# Patient Record
Sex: Female | Born: 1950 | Race: Black or African American | Hispanic: No | Marital: Single | Smoking: Current some day smoker
Health system: Southern US, Community
[De-identification: ages and names within clinical notes are randomized; demographics above are authoritative.]

## PROBLEM LIST (undated history)

## (undated) DIAGNOSIS — E079 Disorder of thyroid, unspecified: Secondary | ICD-10-CM

## (undated) DIAGNOSIS — E7800 Pure hypercholesterolemia, unspecified: Secondary | ICD-10-CM

## (undated) DIAGNOSIS — K219 Gastro-esophageal reflux disease without esophagitis: Secondary | ICD-10-CM

## (undated) DIAGNOSIS — I10 Essential (primary) hypertension: Secondary | ICD-10-CM

## (undated) HISTORY — PX: ABDOMINAL HYSTERECTOMY: SHX81

---

## 2020-09-19 ENCOUNTER — Emergency Department (HOSPITAL_COMMUNITY): Payer: Medicare Other

## 2020-09-19 ENCOUNTER — Emergency Department (HOSPITAL_COMMUNITY)
Admission: EM | Admit: 2020-09-19 | Discharge: 2020-09-19 | Disposition: A | Discharge status: Home / Self Care | Payer: Medicare Other | Attending: Emergency Medicine | Admitting: Emergency Medicine

## 2020-09-19 ENCOUNTER — Other Ambulatory Visit: Payer: Self-pay

## 2020-09-19 ENCOUNTER — Encounter (HOSPITAL_COMMUNITY): Payer: Self-pay | Admitting: Emergency Medicine

## 2020-09-19 DIAGNOSIS — R109 Unspecified abdominal pain: Secondary | ICD-10-CM | POA: Diagnosis not present

## 2020-09-19 DIAGNOSIS — N644 Mastodynia: Secondary | ICD-10-CM | POA: Insufficient documentation

## 2020-09-19 DIAGNOSIS — F17210 Nicotine dependence, cigarettes, uncomplicated: Secondary | ICD-10-CM | POA: Diagnosis not present

## 2020-09-19 DIAGNOSIS — J189 Pneumonia, unspecified organism: Secondary | ICD-10-CM

## 2020-09-19 DIAGNOSIS — J181 Lobar pneumonia, unspecified organism: Secondary | ICD-10-CM | POA: Diagnosis not present

## 2020-09-19 DIAGNOSIS — I10 Essential (primary) hypertension: Secondary | ICD-10-CM | POA: Diagnosis not present

## 2020-09-19 DIAGNOSIS — R0602 Shortness of breath: Secondary | ICD-10-CM | POA: Diagnosis present

## 2020-09-19 HISTORY — DX: Gastro-esophageal reflux disease without esophagitis: K21.9

## 2020-09-19 HISTORY — DX: Disorder of thyroid, unspecified: E07.9

## 2020-09-19 HISTORY — DX: Pure hypercholesterolemia, unspecified: E78.00

## 2020-09-19 HISTORY — DX: Essential (primary) hypertension: I10

## 2020-09-19 LAB — URINALYSIS, ROUTINE W REFLEX MICROSCOPIC
Bilirubin Urine: NEGATIVE
Glucose, UA: NEGATIVE mg/dL
Hgb urine dipstick: NEGATIVE
Ketones, ur: NEGATIVE mg/dL
Leukocytes,Ua: NEGATIVE
Nitrite: NEGATIVE
Protein, ur: NEGATIVE mg/dL
Specific Gravity, Urine: 1.017 (ref 1.005–1.030)
pH: 5 (ref 5.0–8.0)

## 2020-09-19 LAB — CBC WITH DIFFERENTIAL/PLATELET
Abs Immature Granulocytes: 0.08 10*3/uL — ABNORMAL HIGH (ref 0.00–0.07)
Basophils Absolute: 0.1 10*3/uL (ref 0.0–0.1)
Basophils Relative: 1 %
Eosinophils Absolute: 0.1 10*3/uL (ref 0.0–0.5)
Eosinophils Relative: 1 %
HCT: 30.8 % — ABNORMAL LOW (ref 36.0–46.0)
Hemoglobin: 8.9 g/dL — ABNORMAL LOW (ref 12.0–15.0)
Immature Granulocytes: 1 %
Lymphocytes Relative: 9 %
Lymphs Abs: 1.4 10*3/uL (ref 0.7–4.0)
MCH: 18.4 pg — ABNORMAL LOW (ref 26.0–34.0)
MCHC: 28.9 g/dL — ABNORMAL LOW (ref 30.0–36.0)
MCV: 63.8 fL — ABNORMAL LOW (ref 80.0–100.0)
Monocytes Absolute: 1.5 10*3/uL — ABNORMAL HIGH (ref 0.1–1.0)
Monocytes Relative: 10 %
Neutro Abs: 11.9 10*3/uL — ABNORMAL HIGH (ref 1.7–7.7)
Neutrophils Relative %: 78 %
Platelets: 416 10*3/uL — ABNORMAL HIGH (ref 150–400)
RBC: 4.83 MIL/uL (ref 3.87–5.11)
RDW: 23.7 % — ABNORMAL HIGH (ref 11.5–15.5)
WBC: 15 10*3/uL — ABNORMAL HIGH (ref 4.0–10.5)
nRBC: 0 % (ref 0.0–0.2)

## 2020-09-19 LAB — LIPASE, BLOOD: Lipase: 49 U/L (ref 11–51)

## 2020-09-19 LAB — COMPREHENSIVE METABOLIC PANEL
ALT: 11 U/L (ref 0–44)
AST: 17 U/L (ref 15–41)
Albumin: 3.9 g/dL (ref 3.5–5.0)
Alkaline Phosphatase: 77 U/L (ref 38–126)
Anion gap: 9 (ref 5–15)
BUN: 14 mg/dL (ref 8–23)
CO2: 27 mmol/L (ref 22–32)
Calcium: 9.1 mg/dL (ref 8.9–10.3)
Chloride: 100 mmol/L (ref 98–111)
Creatinine, Ser: 0.61 mg/dL (ref 0.44–1.00)
GFR, Estimated: >60 mL/min (ref >60)
Glucose, Bld: 93 mg/dL (ref 70–99)
Potassium: 3.5 mmol/L (ref 3.5–5.1)
Sodium: 136 mmol/L (ref 135–145)
Total Bilirubin: 1.2 mg/dL (ref 0.3–1.2)
Total Protein: 8.7 g/dL — ABNORMAL HIGH (ref 6.5–8.1)

## 2020-09-19 LAB — TROPONIN I (HIGH SENSITIVITY): Troponin I (High Sensitivity): 5 ng/L (ref <18)

## 2020-09-19 MED ORDER — SODIUM CHLORIDE 0.9 % IV BOLUS
1000.0000 mL | Freq: Once | INTRAVENOUS | Status: AC
  Administered 2020-09-19: 1000 mL via INTRAVENOUS

## 2020-09-19 MED ORDER — DOXYCYCLINE HYCLATE 100 MG PO CAPS: 100.0000 mg | ORAL_CAPSULE | Freq: Two times a day (BID) | ORAL | 0 refills | Status: AC

## 2020-09-19 MED ORDER — KETOROLAC TROMETHAMINE 30 MG/ML IJ SOLN
30.0000 mg | Freq: Once | INTRAMUSCULAR | Status: AC
  Administered 2020-09-19: 30 mg via INTRAVENOUS
  Filled 2020-09-19: qty 1

## 2020-09-19 NOTE — Discharge Instructions (Signed)
He was seen in the emergency department today with left chest and flank pain.  You have developed a pneumonia on the left side of your chest and I am starting you on antibiotics.  The radiologist is recommending repeat x-ray in the next 3 to 4 weeks after your antibiotics are completed.  Please call your primary care doctor for follow-up in the next week to ensure your pain and other symptoms are improving.  There is a small amount of fluid on the x-ray but not enough to require drainage.  This should improve after your pneumonia is treated.  If you develop worsening pain, shortness of breath, passing out, other severe symptoms please return to the emergency department.

## 2020-09-19 NOTE — ED Triage Notes (Signed)
Patient c/o left breast pain/under left breast after wearing wire bra. Per patient she gets the pain every time she wears the wire bra. Per patient pain has been persistent since Friday. Patient reports taking tylenol with some relief-last took at 12am this morning. Per patient aching pain that is worse with laying down. Denies any shortness of breath, nausea, vomiting, diarrhea, dizziness, or fever. Denies any cardiac hx.

## 2020-09-19 NOTE — ED Provider Notes (Signed)
Emergency Department Provider Note   I have reviewed the triage vital signs and the nursing notes.   HISTORY  Chief Complaint Breast Pain   HPI Bonnie Buchanan is a 70 y.o. female with PMH reviewed below presents to the emergency department for evaluation of left flank pain radiating underneath the left breast.  Symptoms have been present for the past 3 days.  Pain is intermittently severe.  She feels most of the pain in the left flank and occasionally some pain underneath the left breast.  She states it feels deeper than her breast tissue.  She is not noticed rashes.  She initially associated this with wearing a wire bra but pain has been persistent.  She is denying fevers or chills.  She occasionally feels short of breath with severe pain. No other modifying factors. She took Tylenol, Naproxen, and Gas pils OTC w/o relief.   Past Medical History:  Diagnosis Date  . GERD (gastroesophageal reflux disease)   . High cholesterol   . Hypertension   . Thyroid disease     There are no problems to display for this patient.   Past Surgical History:  Procedure Laterality Date  . ABDOMINAL HYSTERECTOMY      Allergies Patient has no known allergies.  Family History  Problem Relation Age of Onset  . Hypertension Mother     Social History Social History   Tobacco Use  . Smoking status: Current Some Day Smoker    Types: Cigarettes  . Smokeless tobacco: Never Used  Vaping Use  . Vaping Use: Never used  Substance Use Topics  . Alcohol use: Never  . Drug use: Never    Review of Systems  Constitutional: No fever/chills Eyes: No visual changes. ENT: No sore throat. Cardiovascular: Positive chest pain. Respiratory: Denies shortness of breath. Gastrointestinal: No abdominal pain. Positive left flank pain.  No nausea, no vomiting.  No diarrhea.  No constipation. Genitourinary: Negative for dysuria. Musculoskeletal: Negative for back pain. Skin: Negative for  rash. Neurological: Negative for headaches, focal weakness or numbness.  10-point ROS otherwise negative.  ____________________________________________   PHYSICAL EXAM:  VITAL SIGNS: ED Triage Vitals [09/19/20 0709]  Enc Vitals Group     BP (!) 160/82     Pulse Rate 79     Resp 18     Temp 97.9 F (36.6 C)     Temp Source Oral     SpO2 98 %     Weight 123 lb (55.8 kg)     Height 5\' 1"  (1.549 m)   Constitutional: Alert and oriented. Well appearing and in no acute distress. Eyes: Conjunctivae are normal. Head: Atraumatic. Nose: No congestion/rhinnorhea. Mouth/Throat: Mucous membranes are moist.  Neck: No stridor.   Cardiovascular: Normal rate, regular rhythm. Good peripheral circulation. Grossly normal heart sounds.   Respiratory: Normal respiratory effort.  No retractions. Lungs CTAB. Gastrointestinal: Soft and nontender. No distention.  Musculoskeletal: No lower extremity tenderness nor edema. No gross deformities of extremities. Neurologic:  Normal speech and language. No gross focal neurologic deficits are appreciated.  Skin:  Skin is warm, dry and intact. No rash noted.   ____________________________________________   LABS (all labs ordered are listed, but only abnormal results are displayed)  Labs Reviewed  COMPREHENSIVE METABOLIC PANEL - Abnormal; Notable for the following components:      Result Value   Total Protein 8.7 (*)    All other components within normal limits  CBC WITH DIFFERENTIAL/PLATELET - Abnormal; Notable for the following components:  WBC 15.0 (*)    Hemoglobin 8.9 (*)    HCT 30.8 (*)    MCV 63.8 (*)    MCH 18.4 (*)    MCHC 28.9 (*)    RDW 23.7 (*)    Platelets 416 (*)    Neutro Abs 11.9 (*)    Monocytes Absolute 1.5 (*)    Abs Immature Granulocytes 0.08 (*)    All other components within normal limits  URINALYSIS, ROUTINE W REFLEX MICROSCOPIC - Abnormal; Notable for the following components:   APPearance HAZY (*)    All other  components within normal limits  URINE CULTURE  LIPASE, BLOOD  TROPONIN I (HIGH SENSITIVITY)  TROPONIN I (HIGH SENSITIVITY)   ____________________________________________  EKG   EKG Interpretation  Date/Time:  Sunday Sep 19 2020 07:09:06 EDT Ventricular Rate:  76 PR Interval:  144 QRS Duration: 82 QT Interval:  415 QTC Calculation: 467 R Axis:   13 Text Interpretation: Sinus rhythm Probable left atrial enlargement LVH with secondary repolarization abnormality Baseline wander in lead(s) II III aVF Confirmed by Alona Bene [phone]) on 09/19/2020 7:29:12 AM       ____________________________________________  RADIOLOGY  DG Chest Portable 1 View  Result Date: 09/19/2020 CLINICAL DATA:  69 year old female with left side chest pain and shortness of breath when lying down. Smoker. EXAM: PORTABLE CHEST 1 VIEW COMPARISON:  CT Abdomen and Pelvis today reported separately. FINDINGS: Portable AP upright view at 0730 hours. Asymmetric patchy and indistinct left lung base opacity, better demonstrated by CT today. Lung markings elsewhere appear symmetric, with mild diffuse increased pulmonary interstitial opacity. Mild cardiomegaly. Other mediastinal contours are within normal limits. Visualized tracheal air column is within normal limits. No pneumothorax. Stable cholecystectomy clips. Negative visible bowel gas pattern. No acute osseous abnormality identified. IMPRESSION: 1. Suspected left lung base pneumonia and small layering pleural effusion better demonstrated by CT today. Followup PA and lateral chest X-ray is recommended in 3-4 weeks following trial of antibiotic therapy to ensure resolution and exclude underlying malignancy. 2. Superimposed mild diffuse increased pulmonary interstitial opacity, likely smoking related. 3. Mild cardiomegaly. Electronically Signed   By: Odessa Fleming M.D.   On: 09/19/2020 08:14   CT Renal Stone Study  Result Date: 09/19/2020 CLINICAL DATA:  70 year old female with  flank pain, left side pain under breast. EXAM: CT ABDOMEN AND PELVIS WITHOUT CONTRAST TECHNIQUE: Multidetector CT imaging of the abdomen and pelvis was performed following the standard protocol without IV contrast. COMPARISON:  Portable chest x-ray today reported separately. FINDINGS: Lower chest: Mild cardiomegaly. Small layering left pleural effusion and abnormal reticulonodular opacity at the left lung base. Lower lobe predominantly affected. Lingula relatively spared. Contralateral right lung base appears negative aside from mild atelectasis. No pericardial effusion. Hepatobiliary: Negative noncontrast liver.  Absent gallbladder. Pancreas: Negative. Spleen: Negative. Adrenals/Urinary Tract: Normal adrenal glands. Negative noncontrast kidneys aside from faint renal vascular calcification on the left. No convincing nephrolithiasis. Ureters are decompressed and normal to the bladder. Decompressed and unremarkable bladder. Stomach/Bowel: Redundant sigmoid colon which tracks into the epigastrium. Diverticulosis throughout the transverse and descending colon. Mild respiratory motion. Diverticulosis also in the right colon. No active large bowel inflammation identified. Normal appendix on coronal image 34. The cecum is on a lax mesentery. Negative terminal ileum. No dilated small bowel. Decompressed stomach and duodenum. No free air or free fluid. Vascular/Lymphatic: Aortoiliac calcified atherosclerosis. Normal caliber abdominal aorta. Vascular patency is not evaluated in the absence of IV contrast. No lymphadenopathy. Reproductive: Absent uterus.  Diminutive  or absent ovaries. Other: No pelvic free fluid. Musculoskeletal: Osteopenia. No acute osseous abnormality identified. Visible left lower ribs appear intact. IMPRESSION: 1. Abnormal left lung base with small layering left pleural effusion and abnormal reticulonodular opacity most suggestive of acute Pneumonia. 2. No urinary calculus or obstructive uropathy. No  acute or inflammatory process identified in the non-contrast abdomen or pelvis. 3. Widespread diverticulosis of the large bowel without active inflammation. 4. Mild cardiomegaly.  Aortic Atherosclerosis (ICD10-I70.0). Electronically Signed   By: Odessa Fleming M.D.   On: 09/19/2020 08:13    ____________________________________________   PROCEDURES  Procedure(s) performed:   Procedures  None  ____________________________________________   INITIAL IMPRESSION / ASSESSMENT AND PLAN / ED COURSE  Pertinent labs & imaging results that were available during my care of the patient were reviewed by me and considered in my medical decision making (see chart for details).   Patient presents to the emergency department for evaluation of left flank and chest pain.  I do not appreciate an overlying rash to suspect shingles.  Her pain is mainly in the left flank and so kidney stone, pancreatitis, diverticulitis are on the differential.  Pain seems to radiate under the left breast and some is consider cardiopulmonary causes of pain such as ACS.  PE felt to be much less likely given most of the pain in the flank and normal vital signs.  Plan for Toradol here, CT renal, chest x-ray, EKG, troponin.  EKG interpreted by me as above.  Lab work reviewed.  Patient has a leukocytosis to 15.  Chest x-ray and CT show a left lower lobe pneumonia with effusion.  Patient is afebrile.  She is not having increased work of breathing, hypoxemia, kidney injury, uremia, or under indication for inpatient treatment of pneumonia.  Plan to start doxycycline.  She has a primary care doctor in [place] and will call them tomorrow for follow-up in the next week.  Discussed radiologist recommendation of follow-up chest x-ray in the next 3 to 4 weeks after antibiotics. ____________________________________________  FINAL CLINICAL IMPRESSION(S) / ED DIAGNOSES  Final diagnoses:  Community acquired pneumonia of left lower lobe of lung      MEDICATIONS GIVEN DURING THIS VISIT:  Medications  sodium chloride 0.9 % bolus 1,000 mL (1,000 mLs Intravenous New Bag/Given 09/19/20 0758)  ketorolac (TORADOL) 30 MG/ML injection 30 mg (30 mg Intravenous Given 09/19/20 0804)     NEW OUTPATIENT MEDICATIONS STARTED DURING THIS VISIT:  New Prescriptions   DOXYCYCLINE (VIBRAMYCIN) 100 MG CAPSULE    Take 1 capsule (100 mg total) by mouth 2 (two) times daily for 7 days.    Note:  This document was prepared using Dragon voice recognition software and may include unintentional dictation errors.  Alona Bene, MD, [facility]    Smita Lesh, Arlyss Repress, MD 09/19/20 [id number]

## 2020-09-20 LAB — URINE CULTURE: Culture: <10000 — AB

## 2021-10-26 IMAGING — CT CT RENAL STONE PROTOCOL
2 of 4 series · 15 of 46 positions shown, 17 images · non-contrast
Comparison: Portable chest x-ray today reported separately.

CLINICAL DATA: 69-year-old female with flank pain, left side pain
under breast.

EXAM:
CT ABDOMEN AND PELVIS WITHOUT CONTRAST
TECHNIQUE: Multidetector CT imaging of the abdomen and pelvis was performed
following the standard protocol without IV contrast.

[Series 2: axial st · axial · 0.84mm/px · z∈[+916,+1306]mm · 12 of 88 slices shown, 14 images]
[im 5/88  soft-tissue]
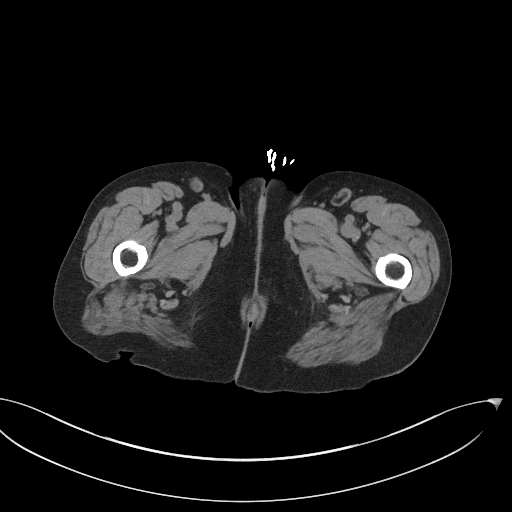
[im 5/88  bone]
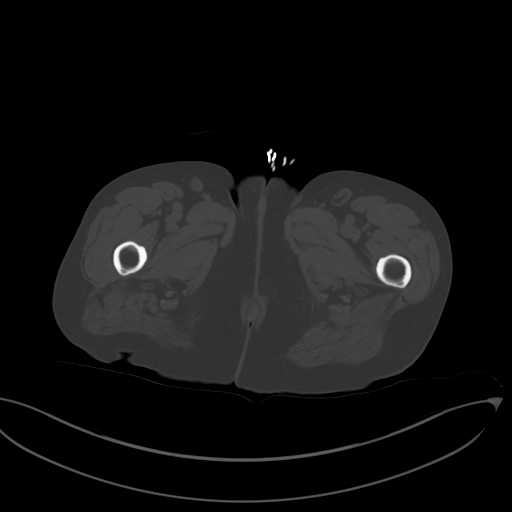
[im 14/88  soft-tissue]
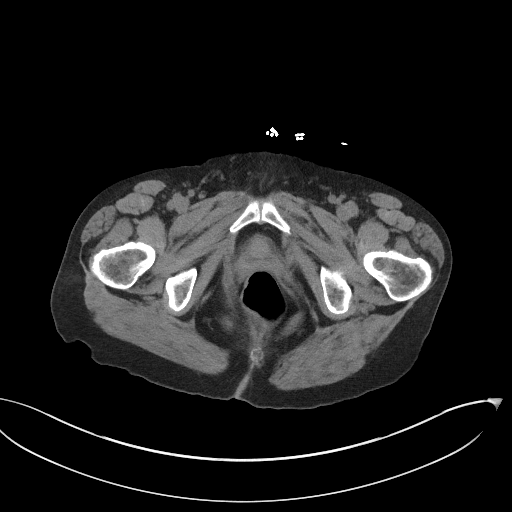
[im 19/88  soft-tissue]
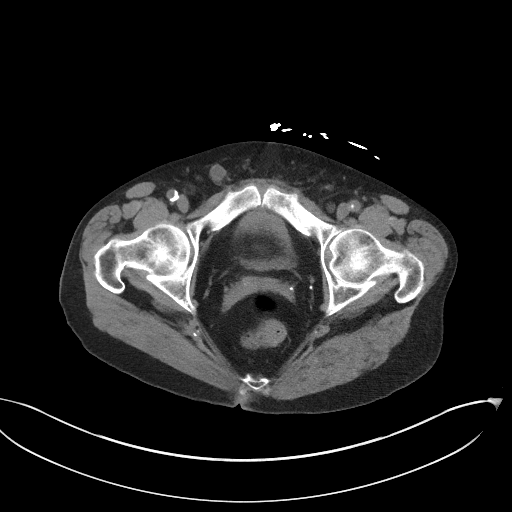
[im 28/88  soft-tissue]
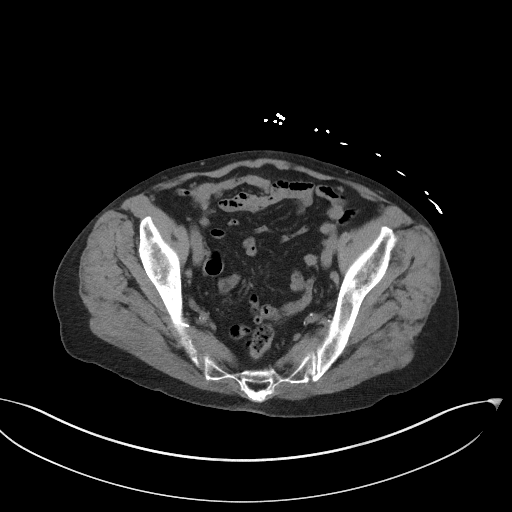
[im 33/88  soft-tissue]
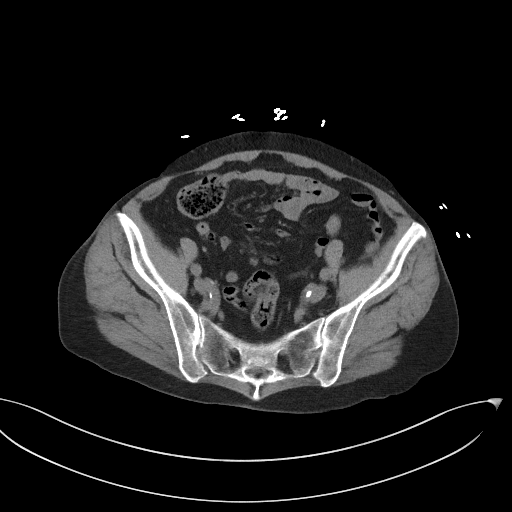
[im 42/88  soft-tissue]
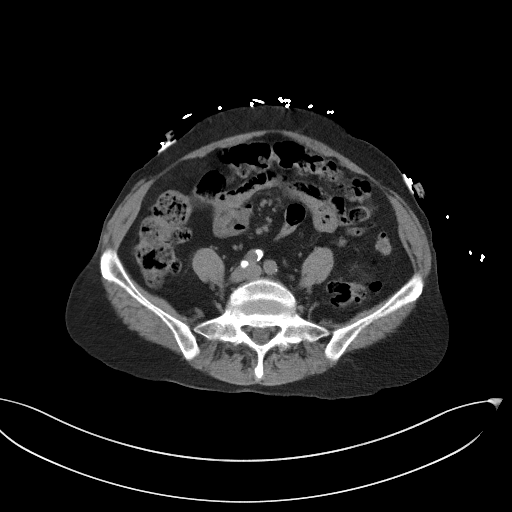
[im 46/88  soft-tissue]
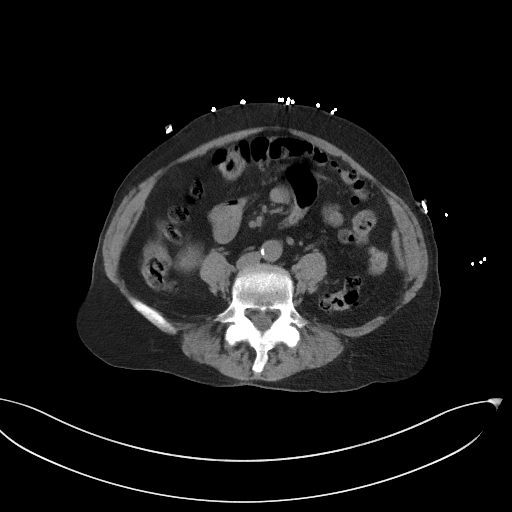
[im 55/88  soft-tissue]
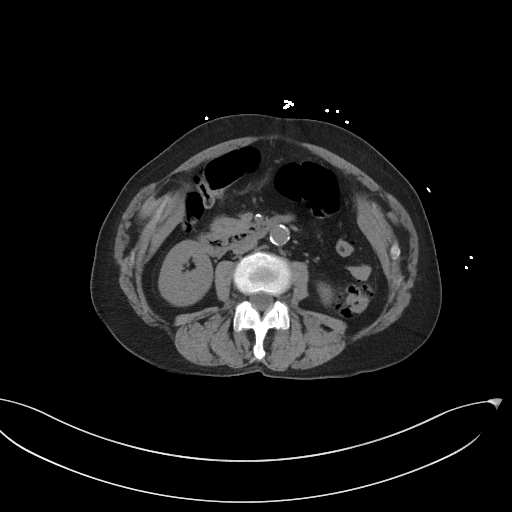
[im 60/88  soft-tissue]
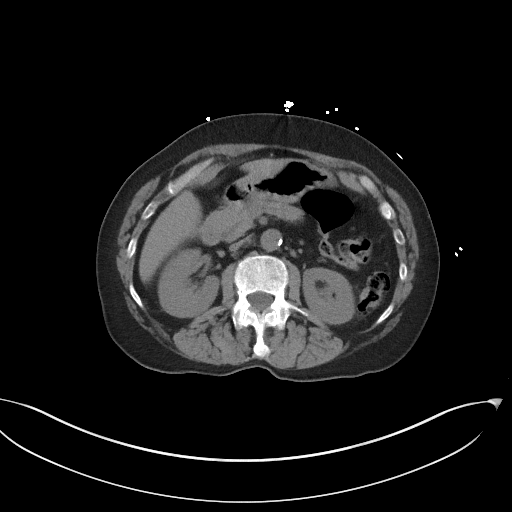
[im 60/88  bone]
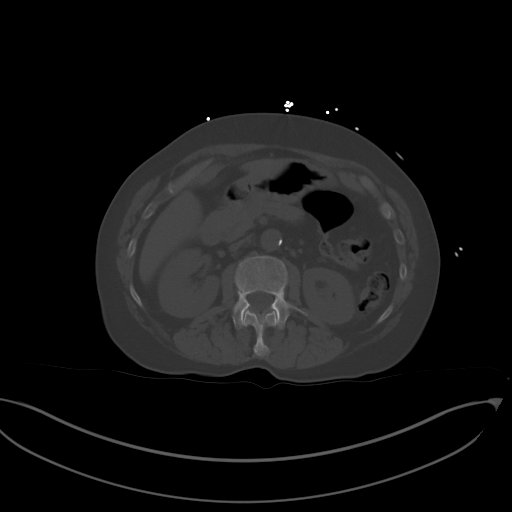
[im 69/88  soft-tissue]
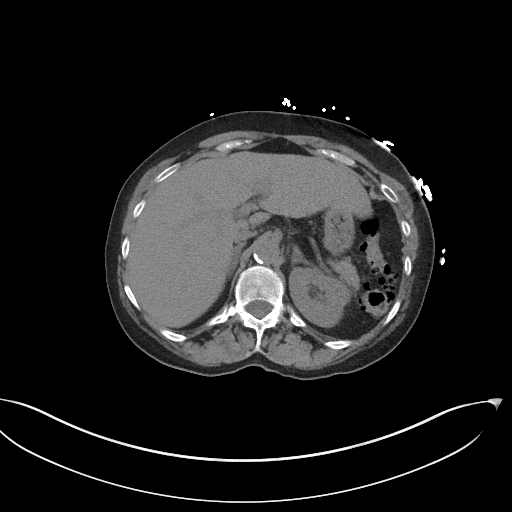
[im 74/88  soft-tissue]
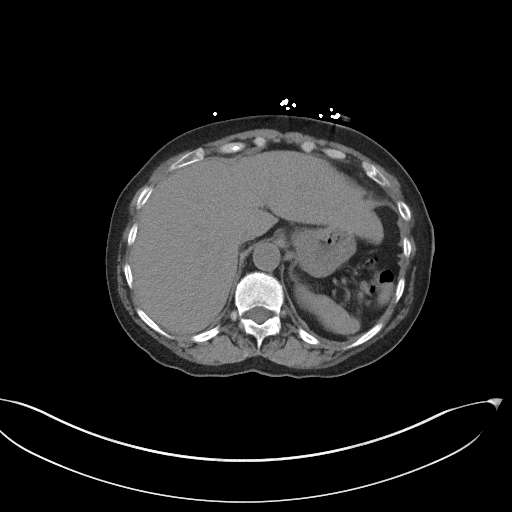
[im 83/88  soft-tissue]
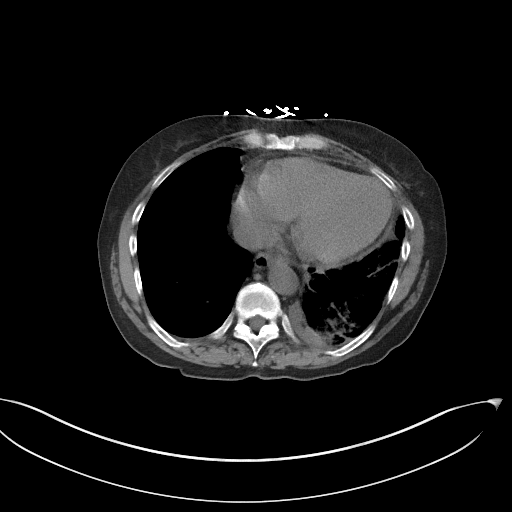

[Series 5: coronal st · coronal · 0.73mm/px · 3 of 100 slices shown]
[im 34/100  soft-tissue]
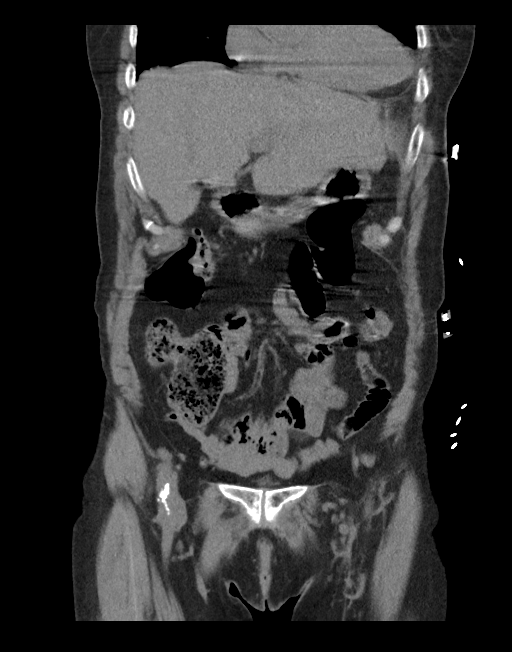
[im 45/100  soft-tissue]
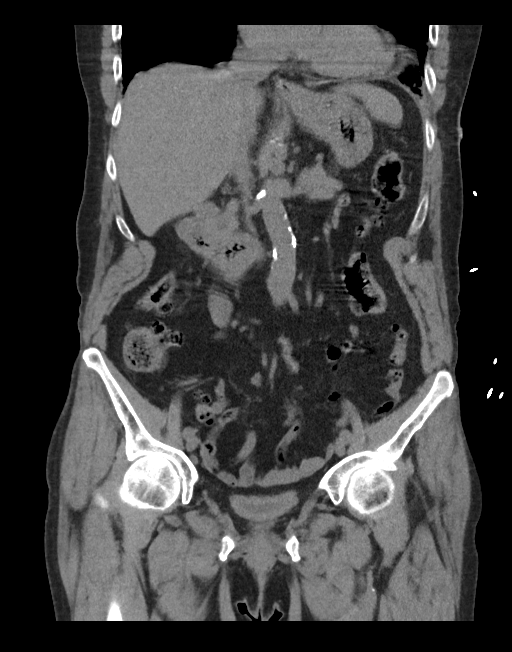
[im 56/100  soft-tissue]
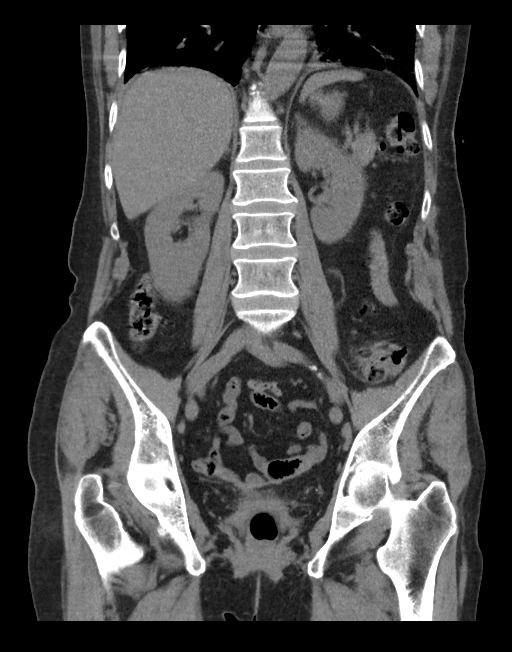

[15 of 46 positions shown; findings below may reference images not displayed]

FINDINGS: Lower chest: Mild cardiomegaly. Small layering left pleural effusion
and abnormal reticulonodular opacity at the left lung base. Lower
lobe predominantly affected. Lingula relatively spared.
Contralateral right lung base appears negative aside from mild
atelectasis. No pericardial effusion.

Hepatobiliary: Negative noncontrast liver.  Absent gallbladder.

Pancreas: Negative.

Spleen: Negative.

Adrenals/Urinary Tract: Normal adrenal glands.

Negative noncontrast kidneys aside from faint renal vascular
calcification on the left. No convincing nephrolithiasis. Ureters
are decompressed and normal to the bladder. Decompressed and
unremarkable bladder.

Stomach/Bowel: Redundant sigmoid colon which tracks into the
epigastrium. Diverticulosis throughout the transverse and descending
colon. Mild respiratory motion. Diverticulosis also in the right
colon. No active large bowel inflammation identified. Normal
appendix on coronal image 34. The cecum is on a lax mesentery.
Negative terminal ileum. No dilated small bowel. Decompressed
stomach and duodenum. No free air or free fluid.

Vascular/Lymphatic: Aortoiliac calcified atherosclerosis. Normal
caliber abdominal aorta. Vascular patency is not evaluated in the
absence of IV contrast. No lymphadenopathy.

Reproductive: Absent uterus.  Diminutive or absent ovaries.

Other: No pelvic free fluid.

Musculoskeletal: Osteopenia. No acute osseous abnormality
identified. Visible left lower ribs appear intact.
IMPRESSION: 1. Abnormal left lung base with small layering left pleural effusion
and abnormal reticulonodular opacity most suggestive of acute
Pneumonia.
2. No urinary calculus or obstructive uropathy. No acute or
inflammatory process identified in the non-contrast abdomen or
pelvis.
3. Widespread diverticulosis of the large bowel without active
inflammation.
4. Mild cardiomegaly.  Aortic Atherosclerosis ([id number]).

## 2021-11-09 ENCOUNTER — Observation Stay
Admission: EM | Admit: 2021-11-09 | Payer: Medicare Other | Source: Other Acute Inpatient Hospital | Admitting: Internal Medicine
